# Patient Record
Sex: Male | Born: 1995 | Race: Black or African American | Hispanic: No | Marital: Single | Smoking: Never smoker
Health system: Southern US, Community
[De-identification: ages and names within clinical notes are randomized; demographics above are authoritative.]

---
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
## 2016-01-09 ENCOUNTER — Ambulatory Visit
Admission: RE | Admit: 2016-01-09 | Discharge: 2016-01-09 | Disposition: A | Discharge status: Home / Self Care | Payer: BLUE CROSS/BLUE SHIELD | Source: Ambulatory Visit | Attending: Sports Medicine | Admitting: Sports Medicine

## 2016-01-09 DIAGNOSIS — M25552 Pain in left hip: Secondary | ICD-10-CM

## 2016-01-09 MED ORDER — IOPAMIDOL (ISOVUE-M 200) INJECTION 41%: 12.0000 mL | Freq: Once | INTRAMUSCULAR | Status: DC

## 2016-01-13 ENCOUNTER — Telehealth: Payer: Self-pay | Admitting: Sports Medicine

## 2016-01-13 NOTE — Telephone Encounter (Signed)
I spoke with Jose Vazquez on the phone today after reviewing the MRI arthrogram of his left hip. It is surprisingly normal. I would like for the patient to see Dr. Thurston HoleWainer in the training room at [employer] tomorrow night for his input. It is possible that Derrill could have an occult labral tear in his hip although the MRI arthrogram is 90% sensitive for this finding. The other possibility could be a sports hernia. I will speak with Dr. Thurston HoleWainer after his evaluation and determine what the next plan of action will be.

## 2016-04-01 ENCOUNTER — Other Ambulatory Visit: Payer: Self-pay | Admitting: *Deleted

## 2016-04-01 ENCOUNTER — Encounter: Payer: Self-pay | Admitting: Sports Medicine

## 2016-04-01 ENCOUNTER — Ambulatory Visit (INDEPENDENT_AMBULATORY_CARE_PROVIDER_SITE_OTHER): Payer: BLUE CROSS/BLUE SHIELD | Admitting: Sports Medicine

## 2016-04-01 DIAGNOSIS — M25559 Pain in unspecified hip: Secondary | ICD-10-CM

## 2016-04-02 NOTE — Progress Notes (Signed)
   Subjective:    Patient ID: Jose Vazquez, male    DOB: 11-04-1995, 20 y.o.   MRN: [id number]  HPI   Patient comes in today with persistent left hip pain. In fact, he is now starting to get pain in both hips. Please see office note from 12/24/2015 for details regarding history. In short, since he injured his hip last season, his pain has not resolved. An MRI arthrogram done in late August showed no evidence of a labral tear. In fact, it was an unremarkable study. Patient has been getting treatment in the training room [facility] [employer] [facility] and has been placed on meloxicam but despite that his pain persists. He localizes his pain to the anterior left hip. It is worse with sprinting and lateral movement. He gets catching and popping in the hip. He denies any pain in his lower abdominal region. He denies any posterior hip pain. No associated numbness or tingling. He has been unable to return to his previous level of activity due to his chronic pain. He is here today with his father.    Review of Systems     Objective:   Physical Exam  Well-developed, fit appearing. No acute distress. Awake alert and oriented 3. Vital signs reviewed  Left hip: Patient continues to have full active and passive range of motion. He has pain with internal rotation and has an audible and palpable pop with FADIR testing. Some mild tenderness to palpation over the proximal rectus femoris. No soft tissue swelling. No tenderness over the greater trochanter. Negative Thomas test. Good strength. He is neurovascularly intact distally.  Examination of his feet show severe pes planus bilaterally      Assessment & Plan:   Persistent left hip pain-question occult labral tear versus internal snapping hip syndrome  Although this patient has a negative MRI arthrogram of his left hip, I'm concerned he may have an occult labral tear not seen on that study. Alternatively he may have internal snapping hip syndrome but I would've  expected his iliopsoas to show some abnormality on the MRI that was the case. Since he continues to struggle and has been unable to return to his previous level of competition, I think it is reasonable for us to get a second opinion. I would like for him to see Larence Penningr.[facility] at [facility]. In the meantime, he will discontinue his meloxicam and instead try 600 mg of ibuprofen or Advil 1 hour prior to practice. Of note, he has pes planus as well so we will put a pair of temporary inserts in his basketball shoes. If he finds these to be comfortable, we could consider custom orthotics at a later date.

## 2016-04-07 ENCOUNTER — Telehealth: Payer: Self-pay | Admitting: Sports Medicine

## 2016-04-07 NOTE — Telephone Encounter (Signed)
[facility] Wallene HuhAl Stubbs Tuesday 05/04/16 at 8am [address] [place] [address] Phone: [phone] Fax: [phone]   [facility] and get a copy of his MRI to bring to his appt

## 2016-04-07 NOTE — Telephone Encounter (Signed)
Will schedule an appointment [facility].

## 2016-10-12 ENCOUNTER — Encounter: Payer: Self-pay | Admitting: Sports Medicine

## 2017-03-15 IMAGING — MR MR HIP*L* W/CM
4 of 5 series · 20 of 40 positions shown · IV contrast (agent unspecified)
Comparison: Radiographs 12/24/2015.  Injection images today.

CLINICAL DATA: 20-year-old with left hip pain and weakness after
basketball injury 6 months ago. Evaluate for labral tear.

EXAM:
MRI OF THE LEFT HIP WITH CONTRAST(MR Arthrogram)
TECHNIQUE: Multiplanar, multisequence MR imaging of the hip was performed
immediately following contrast injection into the hip joint under
fluoroscopic guidance. No intravenous contrast was administered.

[Series 2: T1 · coronal · 4.0mm · 0.35mm/px · 8 of 22 slices shown (1 of 4)]
[im 1/22]
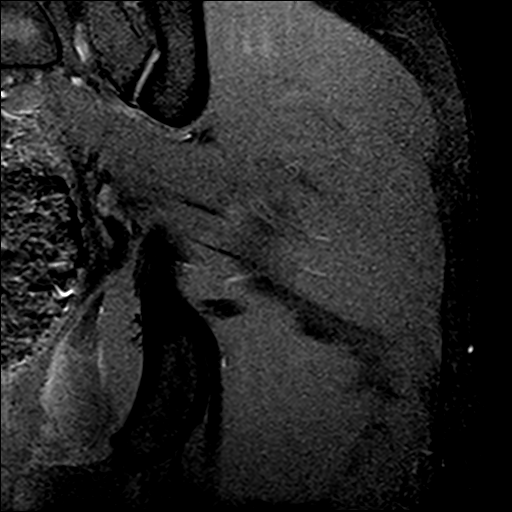
[im 4/22]
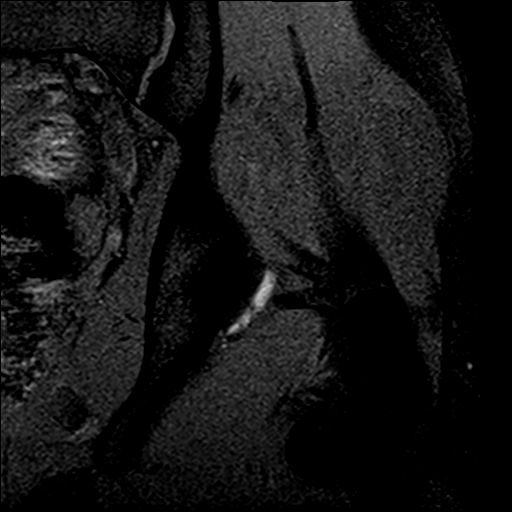
[im 7/22]
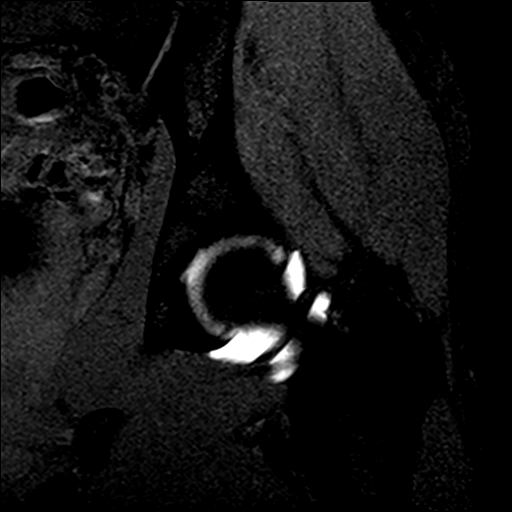
[im 10/22]
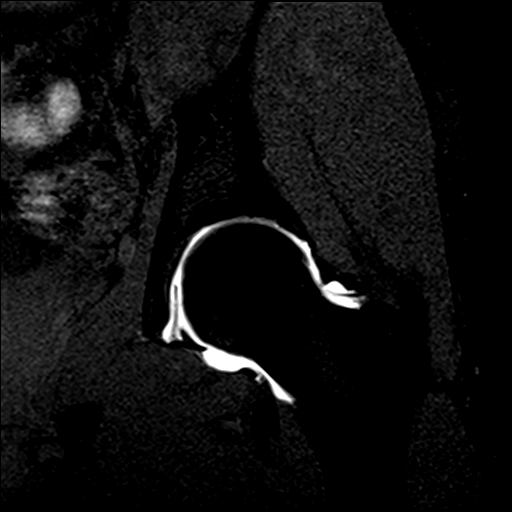
[im 13/22]
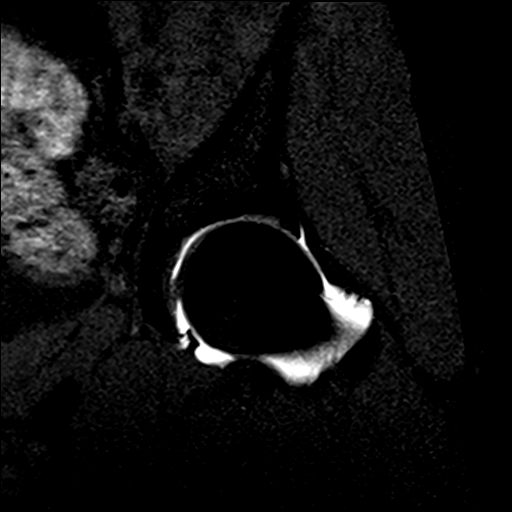
[im 16/22]
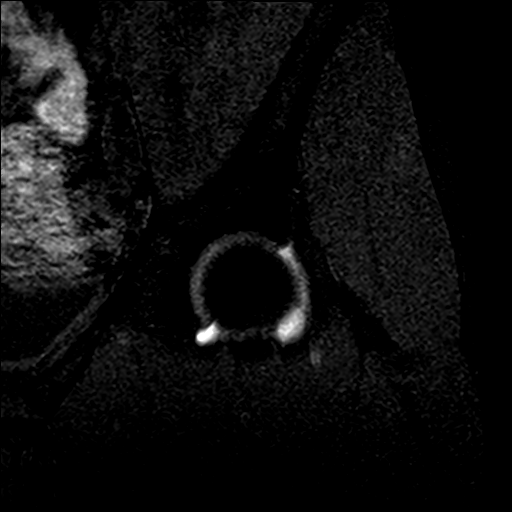
[im 19/22]
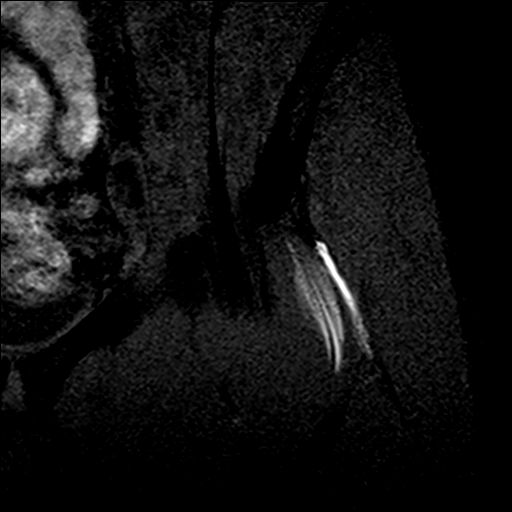
[im 22/22]
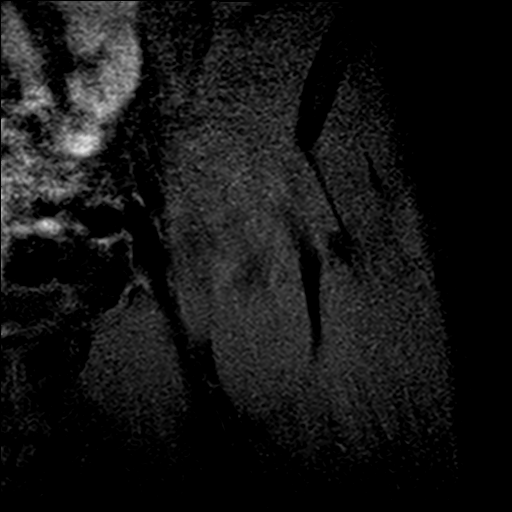

[Series 3: T1 · axial · 4.0mm · 0.35mm/px · z∈[-190,-90]mm · 6 of 30 slices shown (2 of 4)]
[im 1/30]
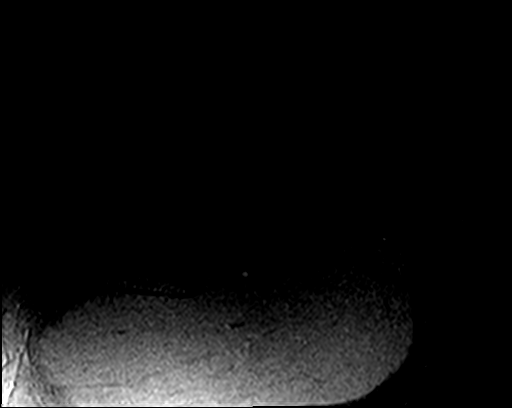
[im 4/30]
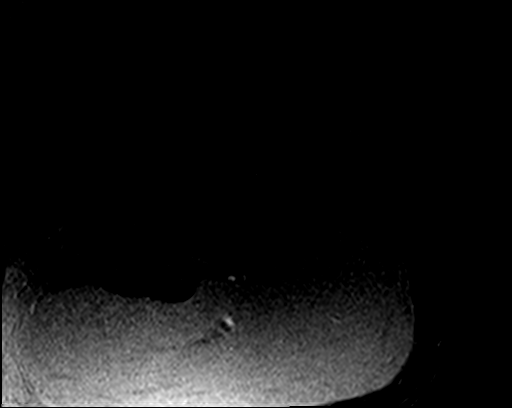
[im 10/30]
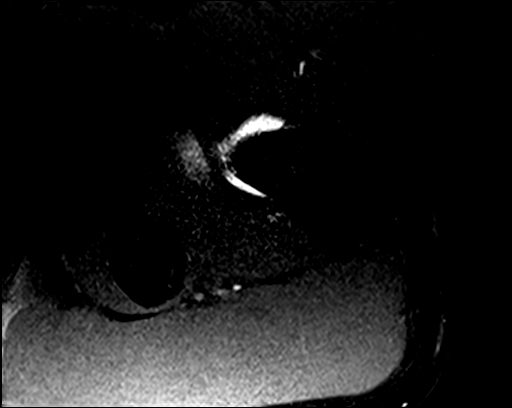
[im 13/30]
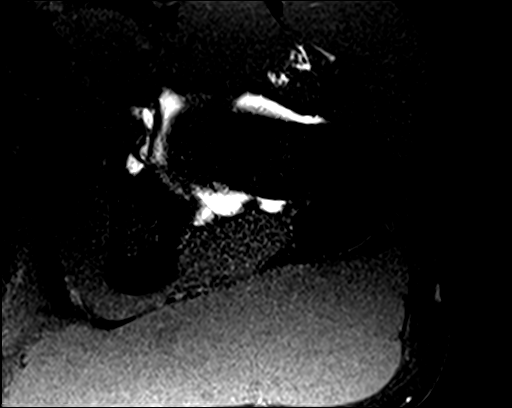
[im 17/30]
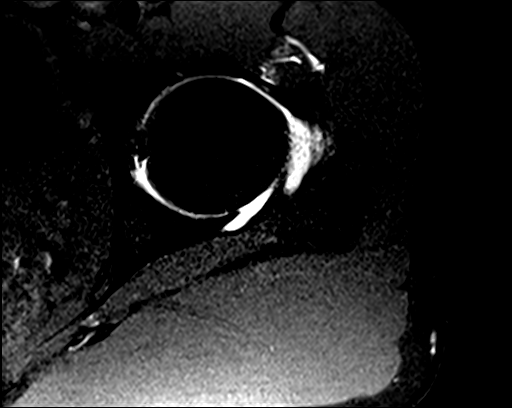
[im 26/30]
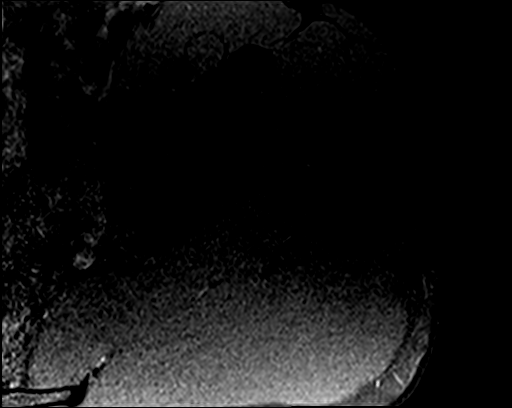

[Series 4: T1 · sagittal · 4.0mm · 0.35mm/px · 3 of 28 slices shown (3 of 4)]
[im 4/28]
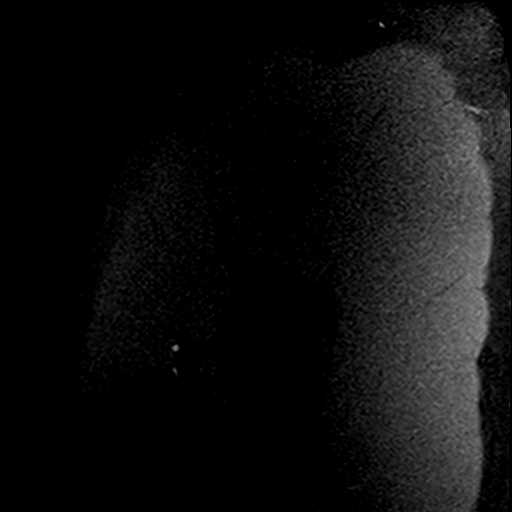
[im 16/28]
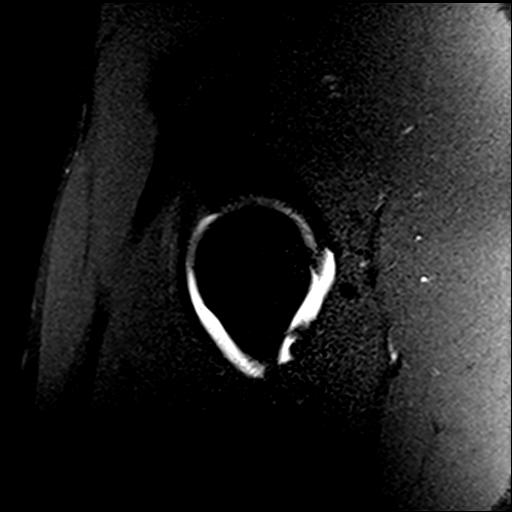
[im 25/28]
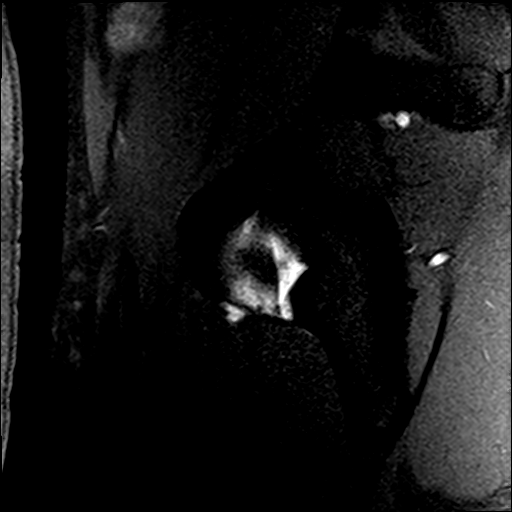

[Series 6: T1 · coronal · 4.0mm · 0.70mm/px · 3 of 18 slices shown (4 of 4)]
[im 4/18]
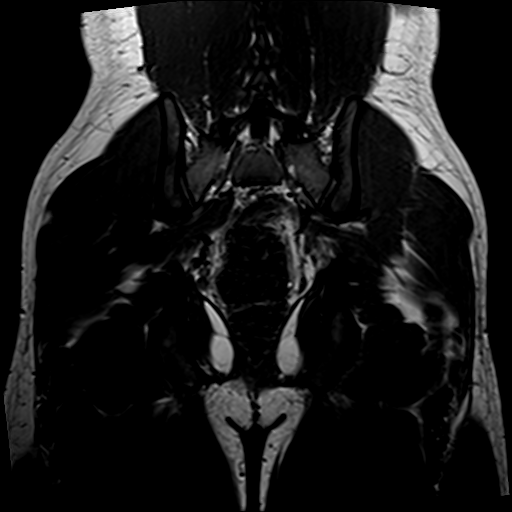
[im 11/18]
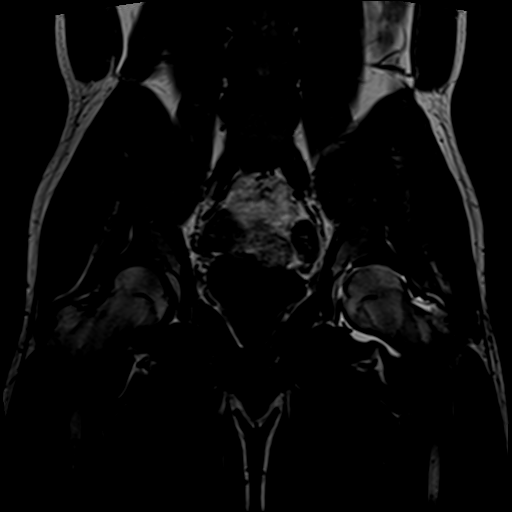
[im 18/18]
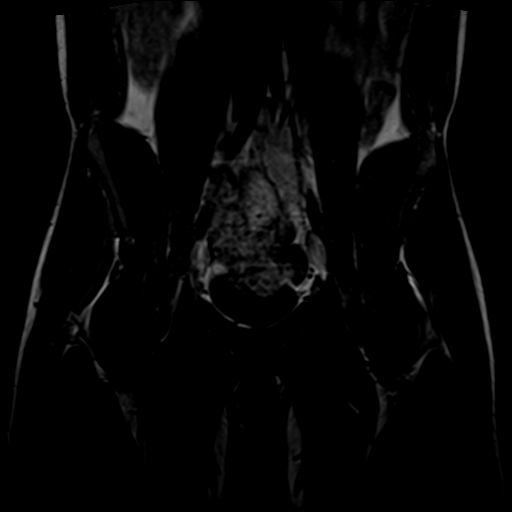

[20 of 40 positions shown; findings below may reference images not displayed]

FINDINGS: Bones: There is no evidence of acute fracture, dislocation or
avascular necrosis. The visualized bony pelvis appears normal. The
visualized sacroiliac joints and symphysis pubis appear normal.

Articular cartilage and labrum

Articular cartilage: Chondral thickness appears well preserved. No
focal chondral defects are seen.

Labrum: No evidence of labral tear or paralabral cyst.

Joint or bursal effusion

Joint effusion: The hip joint is adequately distended with contrast.
No intra-articular loose bodies are seen.

Bursae: No unexpected periarticular fluid collections. A small
amount of T2 hyperintensity anterior to the femoral neck is
attributed to the injection.

Muscles and tendons

Muscles and tendons: The visualized gluteus, hamstring and iliopsoas
tendons appear normal. The piriformis muscles appear symmetric.

Other findings

Miscellaneous: The visualized internal pelvic contents appear
unremarkable.
IMPRESSION: No cause for the patient's left hip pain demonstrated. No evidence
of labral tear.
